# Patient Record
Sex: Female | Born: 2000 | Race: Black or African American | Hispanic: No | Marital: Single | Smoking: Never smoker
Health system: Southern US, Community
[De-identification: ages and names within clinical notes are randomized; demographics above are authoritative.]

## PROBLEM LIST (undated history)

## (undated) DIAGNOSIS — N390 Urinary tract infection, site not specified: Secondary | ICD-10-CM

---
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
## 2022-02-09 DIAGNOSIS — Z113 Encounter for screening for infections with a predominantly sexual mode of transmission: Secondary | ICD-10-CM | POA: Diagnosis not present

## 2022-02-09 DIAGNOSIS — R21 Rash and other nonspecific skin eruption: Secondary | ICD-10-CM | POA: Diagnosis not present

## 2022-02-11 DIAGNOSIS — Z113 Encounter for screening for infections with a predominantly sexual mode of transmission: Secondary | ICD-10-CM | POA: Diagnosis not present

## 2022-02-24 DIAGNOSIS — N760 Acute vaginitis: Secondary | ICD-10-CM | POA: Diagnosis not present

## 2022-02-24 DIAGNOSIS — N898 Other specified noninflammatory disorders of vagina: Secondary | ICD-10-CM | POA: Diagnosis not present

## 2022-03-13 DIAGNOSIS — J029 Acute pharyngitis, unspecified: Secondary | ICD-10-CM | POA: Diagnosis not present

## 2022-03-13 DIAGNOSIS — J020 Streptococcal pharyngitis: Secondary | ICD-10-CM | POA: Diagnosis not present

## 2022-03-22 DIAGNOSIS — R350 Frequency of micturition: Secondary | ICD-10-CM | POA: Diagnosis not present

## 2022-03-22 DIAGNOSIS — Z113 Encounter for screening for infections with a predominantly sexual mode of transmission: Secondary | ICD-10-CM | POA: Diagnosis not present

## 2022-04-25 DIAGNOSIS — Z113 Encounter for screening for infections with a predominantly sexual mode of transmission: Secondary | ICD-10-CM | POA: Diagnosis not present

## 2022-04-25 DIAGNOSIS — N898 Other specified noninflammatory disorders of vagina: Secondary | ICD-10-CM | POA: Diagnosis not present

## 2022-04-25 DIAGNOSIS — R350 Frequency of micturition: Secondary | ICD-10-CM | POA: Diagnosis not present

## 2022-06-30 DIAGNOSIS — Z3202 Encounter for pregnancy test, result negative: Secondary | ICD-10-CM | POA: Diagnosis not present

## 2022-06-30 DIAGNOSIS — Z7251 High risk heterosexual behavior: Secondary | ICD-10-CM | POA: Diagnosis not present

## 2022-06-30 DIAGNOSIS — Z113 Encounter for screening for infections with a predominantly sexual mode of transmission: Secondary | ICD-10-CM | POA: Diagnosis not present

## 2022-07-26 DIAGNOSIS — Z1331 Encounter for screening for depression: Secondary | ICD-10-CM | POA: Diagnosis not present

## 2022-07-26 DIAGNOSIS — Z113 Encounter for screening for infections with a predominantly sexual mode of transmission: Secondary | ICD-10-CM | POA: Diagnosis not present

## 2022-07-26 DIAGNOSIS — Z68.41 Body mass index (BMI) pediatric, 85th percentile to less than 95th percentile for age: Secondary | ICD-10-CM | POA: Diagnosis not present

## 2022-07-26 DIAGNOSIS — Z0001 Encounter for general adult medical examination with abnormal findings: Secondary | ICD-10-CM | POA: Diagnosis not present

## 2022-07-26 DIAGNOSIS — Z0000 Encounter for general adult medical examination without abnormal findings: Secondary | ICD-10-CM | POA: Diagnosis not present

## 2022-07-26 DIAGNOSIS — S90931A Unspecified superficial injury of right great toe, initial encounter: Secondary | ICD-10-CM | POA: Diagnosis not present

## 2022-07-26 DIAGNOSIS — Z713 Dietary counseling and surveillance: Secondary | ICD-10-CM | POA: Diagnosis not present

## 2022-09-28 DIAGNOSIS — Z113 Encounter for screening for infections with a predominantly sexual mode of transmission: Secondary | ICD-10-CM | POA: Diagnosis not present

## 2022-09-28 DIAGNOSIS — N899 Noninflammatory disorder of vagina, unspecified: Secondary | ICD-10-CM | POA: Diagnosis not present

## 2022-10-21 DIAGNOSIS — N390 Urinary tract infection, site not specified: Secondary | ICD-10-CM | POA: Diagnosis not present

## 2023-01-30 DIAGNOSIS — Z113 Encounter for screening for infections with a predominantly sexual mode of transmission: Secondary | ICD-10-CM | POA: Diagnosis not present

## 2023-02-27 DIAGNOSIS — J029 Acute pharyngitis, unspecified: Secondary | ICD-10-CM | POA: Diagnosis not present

## 2023-03-19 DIAGNOSIS — N898 Other specified noninflammatory disorders of vagina: Secondary | ICD-10-CM | POA: Diagnosis not present

## 2023-04-24 DIAGNOSIS — Z113 Encounter for screening for infections with a predominantly sexual mode of transmission: Secondary | ICD-10-CM | POA: Diagnosis not present

## 2023-04-24 DIAGNOSIS — J029 Acute pharyngitis, unspecified: Secondary | ICD-10-CM | POA: Diagnosis not present

## 2023-04-24 DIAGNOSIS — R300 Dysuria: Secondary | ICD-10-CM | POA: Diagnosis not present

## 2023-04-25 DIAGNOSIS — Z23 Encounter for immunization: Secondary | ICD-10-CM | POA: Diagnosis not present

## 2023-06-18 DIAGNOSIS — N898 Other specified noninflammatory disorders of vagina: Secondary | ICD-10-CM | POA: Diagnosis not present

## 2023-06-18 DIAGNOSIS — Z1331 Encounter for screening for depression: Secondary | ICD-10-CM | POA: Diagnosis not present

## 2023-06-18 DIAGNOSIS — B3731 Acute candidiasis of vulva and vagina: Secondary | ICD-10-CM | POA: Diagnosis not present

## 2023-06-18 DIAGNOSIS — N760 Acute vaginitis: Secondary | ICD-10-CM | POA: Diagnosis not present
